# Patient Record
Sex: Female | Born: 1955
Health system: Western US, Academic
[De-identification: ages and names within clinical notes are randomized; demographics above are authoritative.]

---

## 2008-08-07 ENCOUNTER — Emergency Department: Payer: Self-pay | Admitting: Emergency Medicine

## 2010-03-27 ENCOUNTER — Ambulatory Visit: Payer: Self-pay | Admitting: Family Medicine

## 2010-04-11 ENCOUNTER — Ambulatory Visit: Payer: Self-pay | Admitting: Surgery

## 2011-01-01 ENCOUNTER — Encounter: Payer: Self-pay | Admitting: Family Medicine

## 2011-01-25 ENCOUNTER — Encounter: Payer: Self-pay | Admitting: Family Medicine

## 2011-07-09 IMAGING — CT CT ABD-PELV W/ CM
1 of 2 series · 15 of 32 positions shown, 19 images · non-contrast
Comparison: none

REASON FOR EXAM: Left Groin Lymphadenopathy Pain
COMMENTS:

[Series 2: abd with 5.0 i40f · axial · 0.74mm/px · z∈[-96,+309]mm · 15 of 92 slices shown, 19 images]
[im 7/92  soft-tissue]
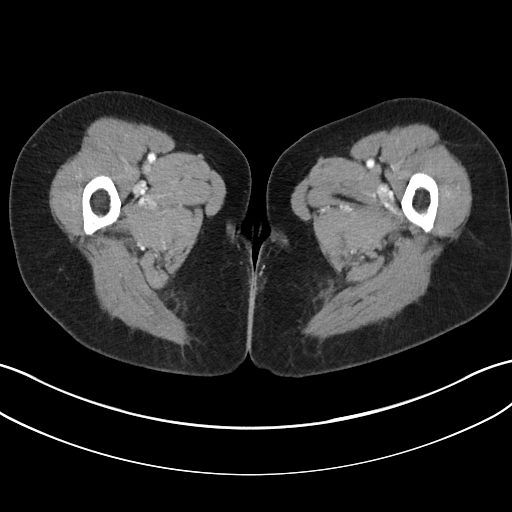
[im 7/92  bone]
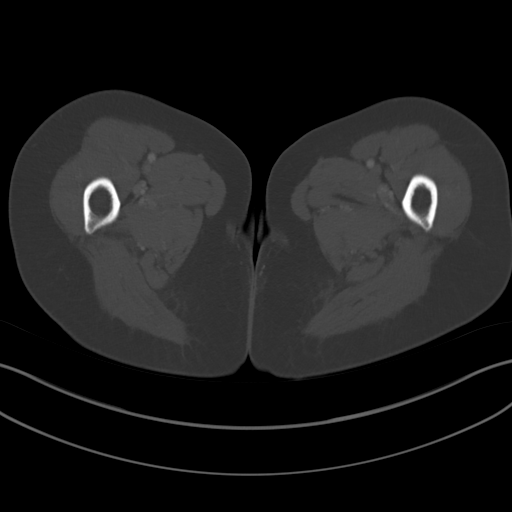
[im 14/92  soft-tissue]
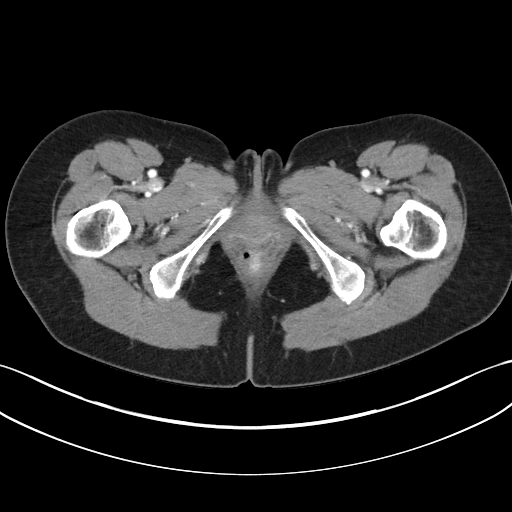
[im 21/92  soft-tissue]
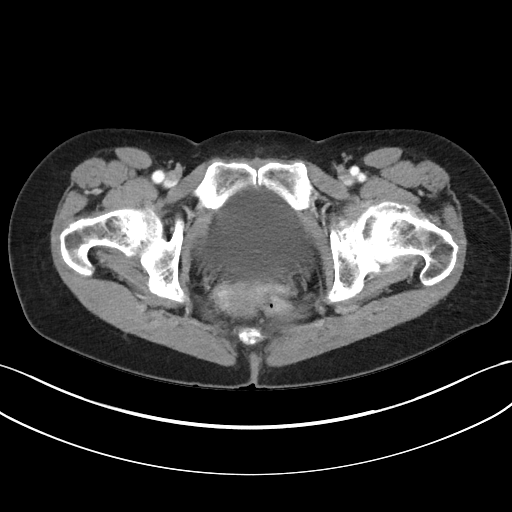
[im 27/92  soft-tissue]
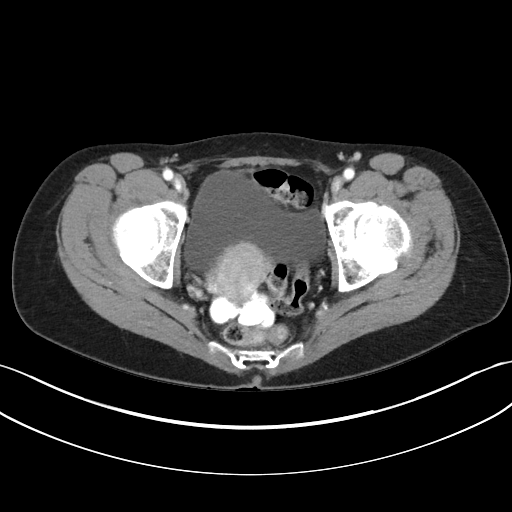
[im 34/92  soft-tissue]
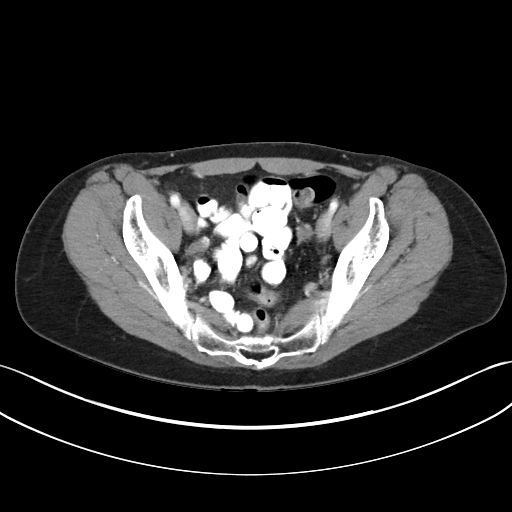
[im 41/92  soft-tissue]
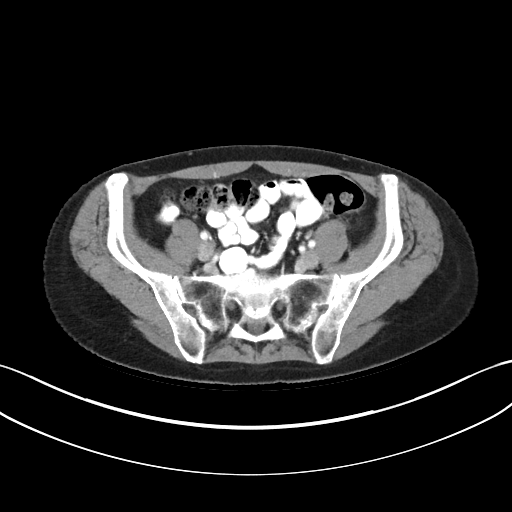
[im 48/92  soft-tissue]
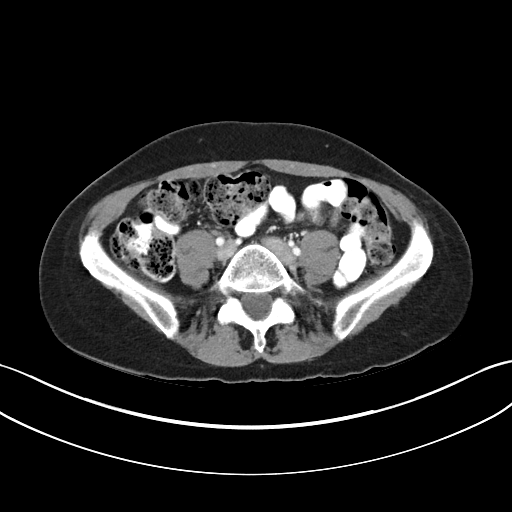
[im 54/92  soft-tissue]
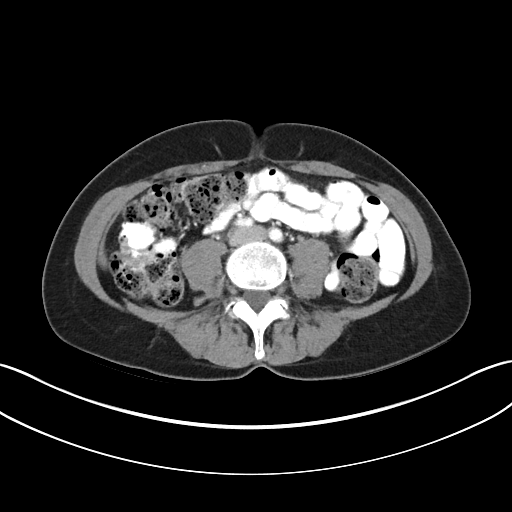
[im 61/92  soft-tissue]
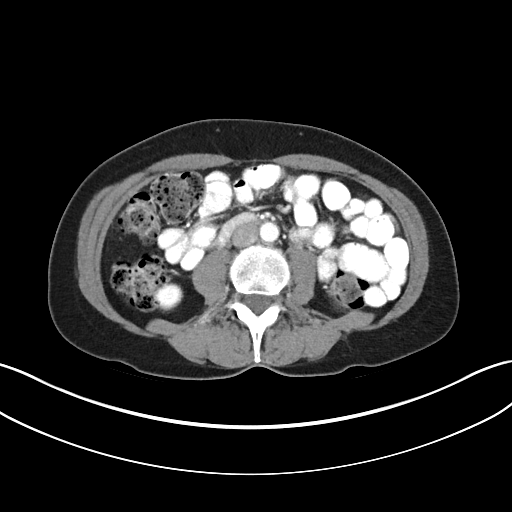
[im 61/92  bone]
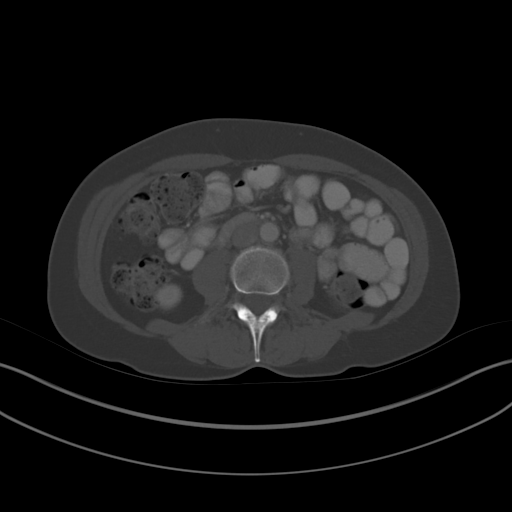
[im 68/92  soft-tissue]
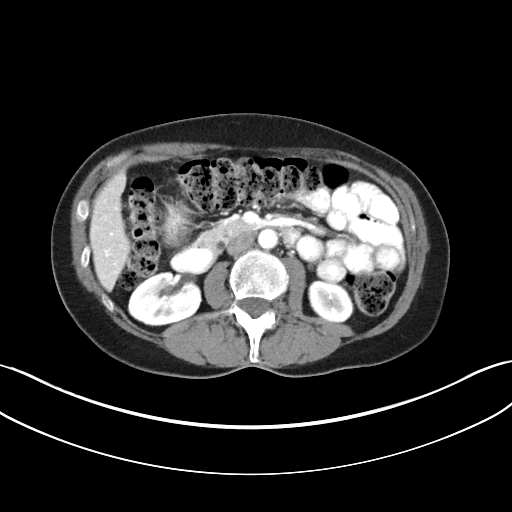
[im 75/92  soft-tissue]
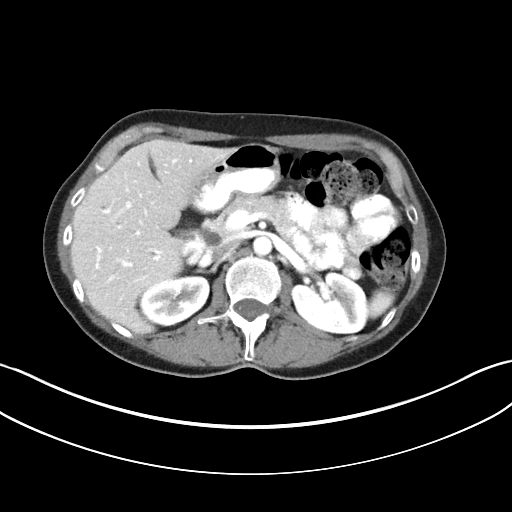
[im 78/92  lung]
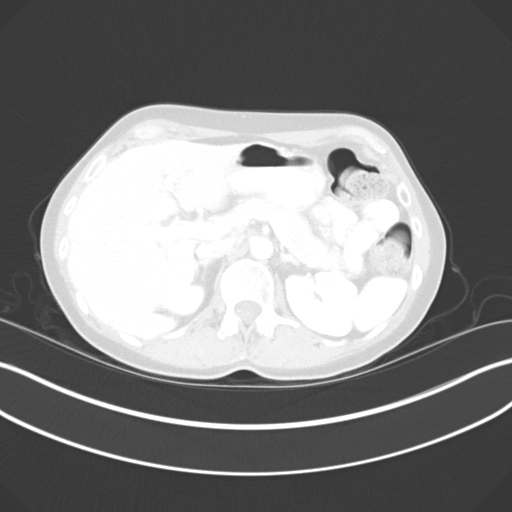
[im 81/92  soft-tissue]
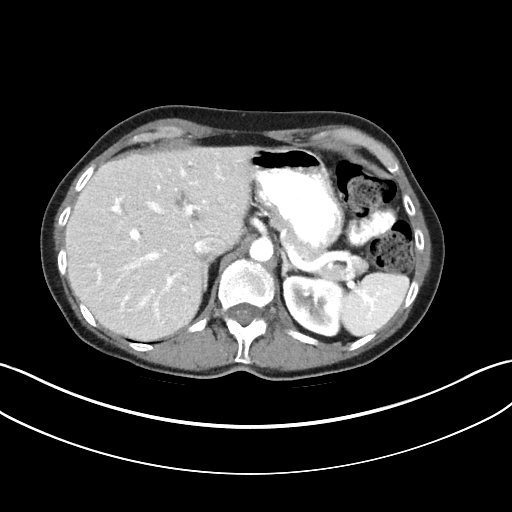
[im 81/92  lung]
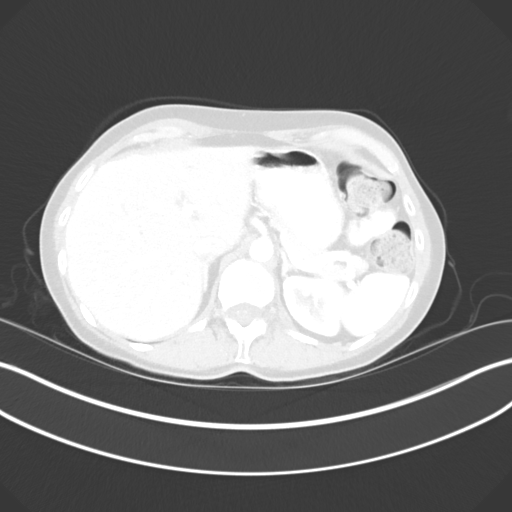
[im 85/92  lung]
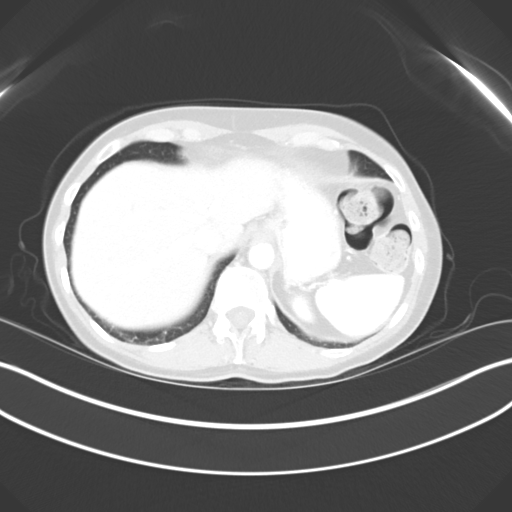
[im 88/92  soft-tissue]
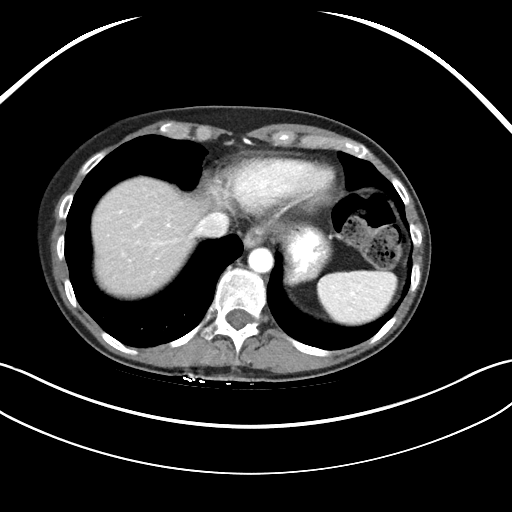
[im 88/92  lung]
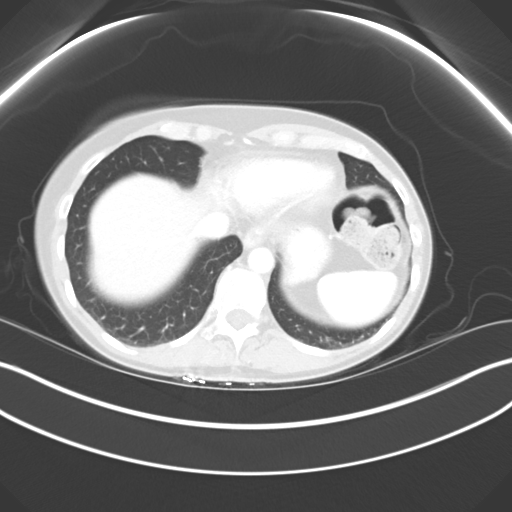

[15 of 32 positions shown; findings below may reference images not displayed]

PROCEDURE:     KCT - KCT ABDOMEN/PELVIS W  - March 27, 2010 [DATE]

RESULT:     Axial CT scanning was performed through the abdomen and pelvis
following intravenous administration of 100 cc of 4sovue-RWP as well as
administration of oral contrast material. Review of multiplanar
reconstructed images was performed separately on the via monitor.

The liver exhibits normal density with no focal mass. There is a trace of
intrahepatic ductal dilation appropriate for the postcholecystectomy state.
The common bile duct is mildly dilated to the head of the pancreas. The
pancreas, spleen, stomach, adrenal glands, and kidneys are normal in
appearance. The caliber of the abdominal aorta is normal. The orally
administered contrast has traversed the small bowel and reached the right
colon. The small bowel is normal in appearance. A moderate amount of stool
is noted throughout the colon. There are are sigmoid diverticula noted but I
do not see evidence of acute diverticulitis. The uterus and adnexal
structures are normal in appearance. There is no free fluid in the abdomen
or pelvis. The caliber of the abdominal aorta is normal. I see no periaortic
nor pericaval lymphadenopathy. I see no inguinal nor umbilical hernia is. No
intra-abdominal or pelvic lymphadenopathy is identified. There are numerous
inguinal lymph nodes but these are subcentimeter in size.

The lung bases are clear. The lumbar vertebral bodies are preserved in
height. There is very gentle curvature of the thoracolumbar spine.
IMPRESSION: 1. I do not see evidence of an intra-abdominal nor pelvic mass.
2. The bowel gas pattern suggests constipation. There is no evidence of
diverticulitis or other acute bowel abnormality. The orally administered
contrast however has not traversed the colon.
3. I do not see acute abnormality of the uterus or adnexa.
4. I do not see acute hepatobiliary abnormality. The gallbladder is
surgically absent.

Followup pelvic ultrasound may be of value if there are clinical or
laboratory values that may indicate pathology of the uterus or adnexa.

## 2016-06-21 ENCOUNTER — Ambulatory Visit (INDEPENDENT_AMBULATORY_CARE_PROVIDER_SITE_OTHER): Payer: BLUE CROSS/BLUE SHIELD

## 2016-06-21 ENCOUNTER — Encounter: Payer: Self-pay | Admitting: Podiatry

## 2016-06-21 ENCOUNTER — Ambulatory Visit (INDEPENDENT_AMBULATORY_CARE_PROVIDER_SITE_OTHER): Payer: BLUE CROSS/BLUE SHIELD | Admitting: Podiatry

## 2016-06-21 VITALS — BP 155/98 | HR 71 | Resp 16

## 2016-06-21 DIAGNOSIS — R52 Pain, unspecified: Secondary | ICD-10-CM | POA: Diagnosis not present

## 2016-06-21 DIAGNOSIS — S93602A Unspecified sprain of left foot, initial encounter: Secondary | ICD-10-CM | POA: Diagnosis not present

## 2016-06-21 DIAGNOSIS — S9032XA Contusion of left foot, initial encounter: Secondary | ICD-10-CM | POA: Diagnosis not present

## 2016-06-21 DIAGNOSIS — M79672 Pain in left foot: Secondary | ICD-10-CM

## 2016-06-21 DIAGNOSIS — M79673 Pain in unspecified foot: Secondary | ICD-10-CM | POA: Diagnosis not present

## 2016-06-21 DIAGNOSIS — R600 Localized edema: Secondary | ICD-10-CM

## 2016-06-21 MED ORDER — NONFORMULARY OR COMPOUNDED ITEM: 1.0000 g | Freq: Four times a day (QID) | 2 refills | Status: AC

## 2016-06-21 MED ORDER — MELOXICAM 15 MG PO TABS: 15.0000 mg | ORAL_TABLET | Freq: Every day | ORAL | 1 refills | Status: AC

## 2016-06-21 NOTE — Progress Notes (Signed)
   Subjective:    Patient ID: Dana ChurchJanna Q Evora, female    DOB: 15-Aug-1956, 60 y.o.   MRN: [id number]  HPI    Review of Systems  HENT: Positive for tinnitus.   Allergic/Immunologic: Positive for food allergies.  All other systems reviewed and are negative.      Objective:   Physical Exam        Assessment & Plan:

## 2016-06-28 ENCOUNTER — Ambulatory Visit: Payer: Self-pay | Admitting: Podiatry

## 2016-06-30 NOTE — Progress Notes (Signed)
Patient ID: Ray ChurchJanna Q Wierman, female   DOB: 05/01/1956, 60 y.o.   MRN: [id number] Subjective:   Patient presents today for pain and tenderness to the left foot. Patient states that she tripped on 06/16/2016 and she noticed immediate pain and tenderness. Patient believes that possibly she broke her foot or ankle. Patient presents today for further treatment and evaluation    Objective/Physical Exam General: The patient is alert and oriented x3 in no acute distress.  Dermatology: Skin is warm, dry and supple bilateral lower extremities. Negative for open lesions or macerations.  Vascular: Palpable pedal pulses bilaterally. No edema or erythema noted. Capillary refill within normal limits.  Neurological: Epicritic and protective threshold grossly intact bilaterally.   Musculoskeletal Exam: Pain on palpation noted throughout the midfoot/Lisfranc joint of the left foot. Moderate edema noted. No pain on palpation to the left ankle joint.  Radiographic Exam:  Normal osseous mineralization. Joint spaces preserved. No fracture/dislocation/boney destruction.   Negative for fracture.  Assessment: #1 Lisfranc foot sprain left #2 edema left foot #3 pain in left foot #4 ecchymosis left foot  Plan of Care:  #1 Patient was evaluated. #2 discussed x-ray findings negative for fracture. #3 prescription for anti-inflammatory pain cream [facility] #4 prescription for meloxicam 15 mg #5 recommend rest ice compression and elevation #6 patient return to clinic in 4 weeks   Dr. Felecia ShellingBrent M. Culley Hedeen, DPM [facility]

## 2016-07-23 ENCOUNTER — Ambulatory Visit: Payer: Self-pay | Admitting: Podiatry

## 2016-07-26 ENCOUNTER — Ambulatory Visit: Payer: Self-pay | Admitting: Podiatry

## 2018-07-07 ENCOUNTER — Ambulatory Visit: Payer: BLUE CROSS/BLUE SHIELD

## 2018-07-07 DIAGNOSIS — Z23 Encounter for immunization: Secondary | ICD-10-CM

## 2021-01-30 ENCOUNTER — Ambulatory Visit: Payer: BLUE CROSS/BLUE SHIELD

## 2021-02-21 ENCOUNTER — Ambulatory Visit: Payer: BLUE CROSS/BLUE SHIELD

## 2021-02-28 ENCOUNTER — Ambulatory Visit: Payer: BLUE CROSS/BLUE SHIELD

## 2021-02-28 ENCOUNTER — Telehealth: Payer: BLUE CROSS/BLUE SHIELD

## 2021-02-28 DIAGNOSIS — R1030 Lower abdominal pain, unspecified: Secondary | ICD-10-CM

## 2021-02-28 DIAGNOSIS — K529 Noninfective gastroenteritis and colitis, unspecified: Secondary | ICD-10-CM

## 2021-02-28 DIAGNOSIS — K580 Irritable bowel syndrome with diarrhea: Secondary | ICD-10-CM

## 2021-02-28 MED ORDER — CHOLESTYRAMINE 4 G PO PACK
1 | PACK | Freq: Two times a day (BID) | ORAL | 3 refills | Status: AC
Start: 2021-02-28 — End: ?

## 2021-02-28 NOTE — Patient Instructions
-   Start cholestyramine powder, 4 g/day. Can increase by 4 g at weekly intervals; up to 4 g six times a day    Take IBGard (over-the-counter encapsulated peppermint) to help with your bloating and abdominal pain. You can buy this usually at major pharmacy stores [facility], [facility], [facility] or on [employer]). Max dose is 8 capsules a day. It's all natural. Peppermint is a natural antispasmodic for the gut. Peppermint can rarely cause worsening of heartburn or anal burning but no other downsides to trying. You can play around with how you take it to see what works for you. Can start with taking it 1-2 capsules in the morning one hour before breakfast and take additional capsules during the day as needed. Or you can try taking it before every meal. If you are taking 2 capsules 2-3 times a day and it doesn't work after 2 weeks, you can just stop it.       - can try L-glutamine 5 grams three times a day    - will check blood work and stool tests    - referral to GI dietitian      - Below is a link for breathing exercises, taught by Donnella Sham, one of the nurse practitioners in the [facility]. These breathing exercises can help with abdominal distention, bloating, and overall mindfulness.     [email or web address]

## 2021-02-28 NOTE — Consults
Gastroenterology Consult   ATTENDING: Pati Gallo. Jonpaul Lumm   PATIENT: Kristy Bullock  MRN: [id number]  DOB: February 07, 1956  DATE OF SERVICE: 02/28/2021  REFERRING PROVIDER: Riley Kill., MD  PRIMARY CARE PROVIDER: No primary care provider on file.    REASON FOR REFERRAL:   Chief Complaint   Patient presents with   ? Diarrhea        Subjective:     Chief Complaint:  Kristy Bullock is a 65 y.o. female who presents for evaluation of chronic diarrhea.     65 year old female with a pmh of previous appendectomy and previous cholecystectomy in 1988 who presents for evaluation of IBS.     The patient reports a history of urgency and persistent diarrhea since around December of 2021. Prior to that she had mostly been on the constipated side and would sometimes go up to 3 days without a bowel movement. She recalls that around the time her symptoms started her husband had been on trip to [place] and everyone there contracted a gastroenteritis and then she also contracted this. Her bowel habits never returned to normal after this. For the past 6 months she has had urgent, persistent diarrhea with around 90% of her stools being Bristol 7. She has been going around 6-10 times a day and has lost around 30 pounds and had associated fatigue and low energy. She also has associated bloating, visible abdominal distention, gas, and lower abdominal cramping. The lower abdominal cramping worsens with diarrhea episodes and is partially improved with defecation.    She was previously seen by an outside gastroenterologist in 10/2020 for chronic diarrhea. She reported diarrhea that started around December 2021/January 2022. She had been taking Aleve prior to the onset of diarrhea. CT abdomen/pelvis in 09/2020 showed pancolitis and she was empirically prescribed metronidazole. However, this did not lead to any improvement. She was then prescribed ciprofloxacin and Align.     She then underwent a colonoscopy in 10/2020 that showed diverticulosis but was otherwise normal. Random colon biopsies showed chronic inflammation with increased lamina propria collagen and patchy increases in eosinophils and lymphocytes. Biopsies were insufficient to make a diagnosis of microscopic colitis. Differential included medication injury, infection, IBS, or an early form of microscopic colitis.     Stool studies were negative for bacterial or parasite infections. Most recent labs from 01/2021 show an elevated CRP of 2.0. ESR normal. TTG IgA normal. No anemia. Normal TSH.     Even prior to the diarrhea episodes she was on a restrictive diet and had eliminated coffee, alcohol, aggregate fruits, melons, processed meats, high fructose corn syrup, soy, artificial sweeteners, carbonated products, gluten, onions, garlic, peppers. She has tried the low FODMAP diet in the past. She has also eliminated lactose, low spice foods, caffeine, collaged peptides, and gas producing foods. She was also seen by her PCP who recommended that she only consume meats and carbs and cut out all fruits and vegetables.     She tried imodium in the past which made her diarrhea worse. She was on nortriptyline for a neurologic issue in the past but this caused her to have worse insomnia. She generally avoids pharmaceuticals if she doesn't have to take them. Reports sensitivities to medications. Currently she is just on VSL#3 and Protonix.     She is a professor and Engineer, civil (consulting) in [address]   Family history of IBS and scoliosis in her mother.         Review of System: the  patient denied fevers, chills, fatigue, nausea, vomiting, constipation, melena, hematochezia,  heartburn, acid reflux, chest pain, palpitations, shortness of breath, jaundice, scleral icterus, confusion, or rashes.       Past Medical & Surgical History:  There is no problem list on file for this patient.     No past medical history on file.  No past surgical history on file.    Relevant Family History: [x]   No family history of colorectal cancer, gastric cancer, inflammatory bowel disease or celiac disease.   []   Family history of colorectal cancer    []   Family history of IBD   []   Family history of celiac disease    Family history of IBS in mother      Relevant Social History:  No tobacco or drug use      MEDICATIONS:  Medications that the patient states to be currently taking   Medication Sig   ? pantoprazole 40 mg DR tablet TAKE 1 TABLET BY MOUTH EVERY DAY BEFORE BREAKFAST   ? Probiotic Product (MISC INTESTINAL FLORA REGULAT) CAPS          Allergies :    is allergic to codeine.      Physical Exam:    Vitals: BP 109/69  ~ Pulse 88  ~ Temp 36.6 ?C (97.9 ?F) (Oral)  ~ Resp 17  ~ Ht 5' 6'' (1.676 m)  ~ Wt 114 lb 9.6 oz (52 kg)  ~ SpO2 97%  ~ BMI 18.50 kg/m?    Body mass index is 18.5 kg/m?Marland Kitchen   Constitutional:WD/WN alert, appears stated age and cooperative   HEENT: no scleral icterus visible   Skin: no jaundice visible   Neuro: A and O x3    Psychiatric: Appropriate affect         Lab Review:  No results found for: WBC, HGB, HCT, MCV, PLT  No results found for: CREAT, BUN, NA, K, CL, CO2, ALT, AST, GGT, ALKPHOS, BILITOT, ALBUMIN, AMYLASE, LIPASE  Lab Results   Component Value Date    GLIADINIGA 1.5 02/28/2021    GLIADINIGG <1.0 02/28/2021     No results found for: INR, FE, FEBINDSAT, TIBC, FERRITIN, VITAMINB12, FOLATE  No results found for: HELPYLAB, HELPYLAGSTL      Imaging:     CT Abdomen/Pelvis (09/2020):    IMPRESSION: ?   Moderate pancolitis. ?Correlate clinically for underlying etiology.       GI Studies:     Colonoscopy (10/2020):  Findings:   ?? ? A few diverticula were found in the descending colon and transverse   ?? ? colon.   ?? ? The exam was otherwise without abnormality.   ?? ??Random colon biopsies obtained, Biopsies were taken with a cold forceps   ?? ? for histology.   ?? ? ? ? ? ? ? ? ? ? ? ? ?   FINAL DIAGNOSIS     A.  Random colon, biopsy:  ? Colonic mucosa with increased chronic inflammation, lamina propria collagen and patchy increase in eosinophils.  ? Negative for active inflammation, granulomas and dysplasia.  ?  ?   A microscopic examination is performed unless specimens are designated as ''gross only''.  ?   Electronically signed by Thereasa Solo, MD on 11/06/2020 at ?3:20 PM   COMMENT     Sections from the biopsy show an increased number of lymphocytes, plasma cells and eosinophils in the lamina propria.  Mild patchy intra-epithelial lymphocytes are present, quantitatively is insufficient for a definitive diagnosis  of lymphocytic/collagenous colitis. The findings in the colon biopsy are non-specific with possible etiologies including medication injury, infection, irritable bowel syndrome, allergies, or an early/resolving/variant form of microscopic colitis.         Medical Decision Making addressed on 02/28/2021:      Number and Complexity of Problems Addressed at the Encounter:   [x]   1 or more chronic illness with exacerbation, progression, or side effects of treatment  []   2 or more stable chronic illness  []   1 undiagnosed new problem with uncertain diagnosis  []   1 acute illness with systemic symptoms  []   1 acute complicated injury     Review of Data: I have  [x]  Reviewed/ordered ? 3 unique laboratory, radiology, and/or diagnostic tests noted    [x]  Reviewed prior external notes and incorporated into patient assessment as noted  []  I have independently interpreted test performed by other physician(s) as noted   []  Discussed management or test interpretation with external provider(s) as noted       Risk of Complication and or Morbidity or Mortality of Patient Management including Social Determinants of Health:   [x]   I deem the above diagnoses to have a risk of complication, morbidity or mortality of Moderate   []  The diagnosis or treatment of said conditions is significantly limited by social determinants of health as noted.     Visit Diagnoses  / Problems addressed on 02/28/2021: Encounter Diagnoses   Name Primary?   ? Chronic diarrhea    ? Irritable bowel syndrome with diarrhea Yes   ? Lower abdominal pain           Assessment & Plan :       65 year old female with a pmh of previous appendectomy and previous cholecystectomy in 1988 who presents for evaluation of IBS.     #Post-Infection Irritable Bowel Syndrome:  #Chronic Diarrhea:  #Lower Abdominal Pain:  Patient with around a 6 month history of crampy lower abdominal pain, bloating, visible abdominal distention, and persistent watery diarrhea (mostly Bristol 7). Abdominal pain is related to bowel movements and partially relieved with defecation. Symptoms are exacerbated by dietary intake and she is currently on a very restricted diet of meat and carbs (previously eliminated dairy, gluten, alcohol, certain fruits, carbonated beverages, onions, garlic, etc). Suspect that she likely has post-infection IBS with increased visceral hypersensitivity as symptoms started after she had a viral gastroenteritis back in December 2021. May also have a component of abdominophrenic dyssynergia given visible abdominal distention. However, microscopic colitis would still be in the differential. Previous work up has included a CT abdomen/pelvis that showed pancolitis and she was empirically prescribed metronidazole and ciprofloxacin without improvement. Colonoscopy in 10/2020 was endoscopically normal but random colon biopsies showed chronic inflammation with increased lamina propria collagen and patchy increases in eosinophils and lymphocytes. Biopsies were insufficient to make a diagnosis of microscopic colitis, with differential including medication injury, infection, IBS, or an early form of microscopic colitis (had previously been taking Aleve). She has mostly tried dietary changes and natural therapies for her symptoms as she reports sensitivities to many medications. Currently she is just taking VSL#3 and Protonicx. Discussed the concept of the brain gut axis in detail.     Plan:  - Fecal calprotectin  - Pancreatic stool elastase  - Celiac antibodies and HLA genetic testing as patient on a gluten free diet  - Trial of IBGard for abdominal cramping  - Trial of cholestyramine for diarrhea  - L glutamine  5 g TID for post infection IBS  - Referral to GI Dietitian  - Video link on diaphragmatic breathing exercises provided. Courtesy of Donnella Sham  - Continue relaxation techniques and mindfulness exercises  - If no improvement in symptoms, consider repeat colonoscopy with additional biopsies to evaluate for microscopic colitis  - Discussed potential trial of TCAs in the future, but patient is hesitant at this time given previous side effects from nortriptylline             The above recommendation were discussed with the patient. The patient has all questions answered satisfactorily and is in agreement with this recommended plan of care.    Amahia Madonia L. Lincoln Brigham, MD   02/28/2021 12:52 PM      I reviewed the patient's labs, imaging, and/or endoscopies and my interpretation is above.     I reviewed notes from the patient's outside records and have incorporated this into the patient history.     80  minutes were spent personally by me today on this encounter which may include today's pre-visit review of the chart, time spent during the visit, and  today's time spent  after the visit  documenting and coordinating care.

## 2021-02-28 NOTE — Telephone Encounter
MESSAGE COMPLETE   Message Complete, No further Action Required.    Ok to [employer].   A. Contacted patient to schedule NEW visit with the nutritionist/dietitian  [x]  patient answered  []  left a voicemail  []  no answer  []  wrong number  []  spoke with patient representative  [x]  other/s: please specify:  - requested patient to call her insurance to confirm coverage of nutrition service  - also sent patient message via myChart for referral details and information     B. Informed patient of nutritional counseling insurance' non-coverage overview  [x]  Medicare, PPO, HMO  []  provided the CPT codes  []  option for self-pay  []  clearance from [facility]  []  submission of signed form (5) days before appointment  []  other/s: please specify:    C. Patient scheduled  [x]  yes  []  no  [x]  other/s: comments:   - scheduled and confirmed for 06/07/2021

## 2021-03-01 ENCOUNTER — Ambulatory Visit: Payer: BLUE CROSS/BLUE SHIELD

## 2021-03-05 ENCOUNTER — Ambulatory Visit: Payer: BLUE CROSS/BLUE SHIELD

## 2021-03-05 DIAGNOSIS — R195 Other fecal abnormalities: Secondary | ICD-10-CM

## 2021-03-06 ENCOUNTER — Ambulatory Visit: Payer: BLUE CROSS/BLUE SHIELD

## 2021-03-06 DIAGNOSIS — R195 Other fecal abnormalities: Secondary | ICD-10-CM

## 2021-03-20 ENCOUNTER — Ambulatory Visit: Payer: BLUE CROSS/BLUE SHIELD

## 2021-06-03 ENCOUNTER — Ambulatory Visit: Payer: BLUE CROSS/BLUE SHIELD

## 2021-06-06 ENCOUNTER — Ambulatory Visit: Payer: BLUE CROSS/BLUE SHIELD

## 2021-06-06 ENCOUNTER — Telehealth: Payer: BLUE CROSS/BLUE SHIELD

## 2021-06-06 NOTE — Telephone Encounter
Is patient cleared for her appt?

## 2021-06-07 ENCOUNTER — Telehealth: Payer: BLUE CROSS/BLUE SHIELD

## 2021-06-07 NOTE — Progress Notes
[facility]  Medical Nutrition Therapy   Initial Assessment Note      PATIENT: Kristy Bullock  MRN: [id number]  DOB: Mar 08, 1956  DATE OF SERVICE: 06/07/2021         BILLING METHOD: insurance     SUBJECTIVE     Kristy Bullock is referred by Riley Kill for medical nutrition therapy for collagenous microscopic colitis.    06/07/21:  ? Medical history   o Long history of ''touchy'' gut   - Cutting out many foods over last few decades to avoid painful bloating (distension??) and belching , rashes, and ''dives in metabolism''  o Noise overloads her - uses noise canceling headphones to help diffuse noise   o Tinnitis - goes to background when in a flow state   o Collagenous microscopic colitis (diagnosed 07/2020-08/2020)  - Currently on Budesonide ER 9 mg, cholestyramine 4 grams (2-4 times/day)  - Severe per patient report   - Constant acid and ill feeling per report   - Prior to diagnosis, was having at least 10 BM daily with nocturnal bowel movements   - Some improvement with avoidance of insoluble fiber (fruits and vegetables)   - Cramping   ? Especially bad the last week or so   ? Started on cholestyramine - hoping will help control nausea   - Nausea  ? Prevents her from eating more    ? Bowel habits   o Diarrhea - worse with vegetables and stress    ? Prior to diagnosis - would have at least 10 BM and nocturnal BM   ? Has had increased stress last 3 weeks; was having 4-5 BM daily with use of cholestyramine   ? Good day = 4 bowel movements   ? Bad day = 10 bowel movements   ? Average last month - 6 BM daily; BSFS type 6-7 (stool is difficult to see in toilet after passing - ''in a cloud'')  ? Stringy BSFS type 5   ? No greasiness or oiliness   ? Ongoing nocturnal BM   ? Incomplete evacuation   ? Interventions tried:   ? Cholestyramine (July) - good aid   ? Psyllium husk - stopped due to advice of PCP  ? IBGard - minimal aid   ? Tums - can provide some aid   ? VSL#3 - stopped due to lack of supporting evidence   ? L-glutamine - used 5 g/day - stopped taking   ? Diet   ? Never had body image issues   ? Does not cook and does not wish to cook   ? Long history of not tolerating fillers and additives  ? Using [facility] book for diet guidance now (mix of low FODMAP and paleo, recommends [facility] lab for testing)   ? No longer using low FODMAP diet because diet is so limited   ? Has good appetite per report   ? Poor when ''bile maxxing out'' and has nausea   ? Has been eating over nausea - this is her baseline per report    ? Current intake: ''clean meat'' (baked, baked mashed potatoes, 1/3 green banana, low FODMAP serving size vheddar cheese, Breton GF crackers, GF bread with no additives  ? Able to add brown rice, 1/3 cup rinsed black beans, <2T walnuts or [place] nuts, pumpkin seeds, half can of tuna with mayo, and 1 cup of lays potato chips   ? Currently avoiding: carrageenan, polysorbate 80, carboxymethylcellulose, coconut oil, palm  oil, ''gums'' and ''pastes''   ? Has not been tolerating seafood, pea protein well   ? Started low FODMAP diet in March; no longer following due to restricted diet    ? Was relying on fruits, vegetables, organic chicken - symptoms were worsening   ? Lactose free - poorly tolerated due to additives  ? Gluten free - poorly tolerated due to additives   ? Previously eliminated:   ? Coffee  ? EtOH  ? Aggregate fruits   ? Melons  ? Processed meats  ? Honey   ? HFCS  ? Soy  ? Artificial sweeteners  ? All sugars other than cane sugar  ? Carbonated water   ? Gluten  ? Onions  ? Garlic   ? Peppers   ? ''Sour'' foods - vinegar  ? After [facility] diagnosis, eliminated:  ? Eggs  ? Oats and oat bran   ? caffeinated sodas and teas  ? Lactose   ? Spicy foods (even very mild)   ? Weight history   ? 66''  ? Usual weight: 138 lbs  ? Comfortable weight: 111-113 lbs   ? Current weight: 105 lbs   ? Previous college athlete   ? Feels as though lean body mass is ''gone'' and replaced with wrinkled skin   ? Physical activity   ? Has been working on activity regimen until recently   ? Goal: walk 2 2 mile hikes [place] [address]   ? Unable to go that far - feels ''tied to the toilet''  ? Stress  ? Was using Mahana app for deep breathing   ? Did not feel like visualizing was helpful   ? Has had increased stressors over the last 3 weeks   ? Sleep  ? 5 hours/night due to nocturnal BM     Questions: recommended foods, supplements, weight gain, which foods to avoid, are calcium and vitamin D OK, B vitamins?     Dr. Lincoln Brigham 02/28/21  Assessment & Plan :     65 year old female with a pmh of previous appendectomy and previous cholecystectomy in 1988 who presents for evaluation of IBS.   ?  #Post-Infection Irritable Bowel Syndrome:  #Chronic Diarrhea:  #Lower Abdominal Pain:  Patient with around a 6 month history of crampy lower abdominal pain, bloating, visible abdominal distention, and persistent watery diarrhea (mostly [place] 7). Abdominal pain is related to bowel movements and partially relieved with defecation. Symptoms are exacerbated by dietary intake and she is currently on a very restricted diet of meat and carbs (previously eliminated dairy, gluten, alcohol, certain fruits, carbonated beverages, onions, garlic, etc). Suspect that she likely has post-infection IBS with increased visceral hypersensitivity as symptoms started after she had a viral gastroenteritis back in December 2021. May also have a component of abdominophrenic dyssynergia given visible abdominal distention. However, microscopic colitis would still be in the differential. Previous work up has included a CT abdomen/pelvis that showed pancolitis and she was empirically prescribed metronidazole and ciprofloxacin without improvement. Colonoscopy in 10/2020 was endoscopically normal but random colon biopsies showed chronic inflammation with increased lamina propria collagen and patchy increases in eosinophils and lymphocytes. Biopsies were insufficient to make a diagnosis of microscopic colitis, with differential including medication injury, infection, IBS, or an early form of microscopic colitis (had previously been taking Aleve). She has mostly tried dietary changes and natural therapies for her symptoms as she reports sensitivities to many medications. Currently she is just taking VSL#3 and Protonicx.  Discussed the concept of the brain gut axis in detail.   ?  Plan:  - Fecal calprotectin  - Pancreatic stool elastase  - Celiac antibodies and HLA genetic testing as patient on a gluten free diet  - Trial of IBGard for abdominal cramping  - Trial of cholestyramine for diarrhea  - L glutamine 5 g TID for post infection IBS  - Referral to GI Dietitian  - Video link on diaphragmatic breathing exercises provided. Courtesy of Donnella Sham  - Continue relaxation techniques and mindfulness exercises  - If no improvement in symptoms, consider repeat colonoscopy with additional biopsies to evaluate for microscopic colitis  - Discussed potential trial of TCAs in the future, but patient is hesitant at this time given previous side effects from nortriptylline     DIET  / SUPPLEMENTS     Food Intake:   When feeling better, can add brown rice, black beans (1/3 cup), chopped walnuts and pumpkin seeds, 1/2 can tuna with may + water on GF bread, cheese sandwich with cheddar cheese, Lays' potato chips; tried popsicles in half when craving sugar, clif builder bar (white)   ? Breakfast: whole diet right now: baked chicken (sometimes 1/2 cup), pork tenderloin, mashed potatoes, 1/3 green banana,  cheese    ? Lunch:  [employer] (tapioca base) mixed with almond milk OR gatorade powder   ? Dinner:     ? Snacks: Breton GF crackers, cheddar   ? Beverages: water upset her stomach so she adds       ? Avoided Foods -  Rice flour, apples, milk                       ? Well Tolerated Foods -                                   Supplements: ''chelated magnesium'' 100 mg BID OBJECTIVE     Current weight: (review flow sheet for more weights)  Wt Readings from Last 12 Encounters:   02/28/21 114 lb 9.6 oz (52 kg)     Ht Readings from Last 1 Encounters:   02/28/21 5' 6'' (1.676 m)     There is no height or weight on file to calculate BMI.      Usual body weight:  138 lbs                        Recent weight changes:   Lost to 105 lbs in ~6 months (24% - clinically significant)                   IBW: 130 lbs       %IBW: 81%      Alarm Features: (check for yes)  []  Age of onset >50  []  Family history of colorectal cancer / IBD / celiac disease  []  Rectal bleeding or anemia  []  Nocturnal bowel movements  [x]  Unintentional weight loss  [x]  Persistent daily diarrhea  []  Recurrent vomiting  []  Fever  []  Progressive worsening symptoms    Current Outpatient Medications   Medication Sig   ? cholestyramine 4 g powder packet Take 1 packet (4 g total) by mouth two (2) times daily.   ? pantoprazole 40 mg DR tablet TAKE 1 TABLET BY MOUTH EVERY DAY BEFORE BREAKFAST   ? Probiotic Product (MISC INTESTINAL FLORA REGULAT) CAPS  No current facility-administered medications for this visit.       PERTINENT LABS / PROCEDURES     Component      Latest Ref Rng & Units 02/28/2021 03/01/2021   (TTG) Ab, IgG ([facility])      U/mL <1.0    (TTG) Ab, IgA ([facility])      U/mL <1.0    Gliadin (Deamidated) Ab (IGA) ([facility])      U/mL 1.5    Gliadin (Deamidated) Ab (IGG) ([facility])      U/mL <1.0    Immunoglobulin A ([facility])      70 - 320 mg/dL 696    Calprotectin, Stool ([facility])      mcg/g  422 (H)   Pancreatic Elastase-1 ([facility])      mcg/g  >500     03/09/21  Comment:   The patient has one of the HLA-DQ variants associated   with celiac disease     Triglycerides 30 - 150 mg/dL 86    Cholesterol 80 - 200 mg/dL 295?High?    HDL 30 - 90 mg/dL 46    Chol/HDL Ratio 3.7 - 5.6 4.5    LDL, Calculated 60 - 130 mg/dL 284?High?    VLDL 1 - 40 mg/dL 13.2      VITAMIN G-40 230 - 1050 pg/mL >2000?High?     Vitamin D, 25 Hydroxy 30 - 90 ng/mL 42 Comment: Vitamin D Status ? ? ? ? 24OH Vitamin D     Colonoscopy (10/2020):  Findings:   ?? ? A few diverticula were found in the descending colon and transverse   ?? ? colon.   ?? ? The exam was otherwise without abnormality.   ?? ??Random colon biopsies obtained, Biopsies were taken with a cold forceps   ?? ? for histology.   ?? ? ? ? ? ? ? ? ? ? ? ? ?   FINAL DIAGNOSIS  ?   A. ?Random colon, biopsy:  ? Colonic mucosa with increased chronic inflammation, lamina propria collagen and patchy increase in eosinophils.  ? Negative for active inflammation, granulomas and dysplasia.  ?  ?  ?A microscopic examination is performed unless specimens are designated as ''gross only''.  ?   Electronically signed by Thereasa Solo, MD on 11/06/2020 at ?3:20 PM   COMMENT  ?   Sections from the biopsy show an increased number of lymphocytes, plasma cells and eosinophils in the lamina propria. ?Mild patchy intra-epithelial lymphocytes are present, quantitatively is insufficient for a definitive diagnosis of lymphocytic/collagenous colitis. The findings in the colon biopsy are non-specific with possible etiologies including medication injury, infection, irritable bowel syndrome, allergies, or an early/resolving/variant form of microscopic colitis.   ?     CT Abdomen/Pelvis (09/2020):  ?  IMPRESSION: ?   Moderate pancolitis. ?Correlate clinically for underlying etiology.   ?                                ASSESSMENT     ? Diarrhea/ Microscopic colitis  - Prior to diagnosis, was having 10+ bowel movements daily with nocturnal bowel movements. Tried multiple diet interventions such as low FODMAP with no aid to symptoms. Recently diagnosed s/p colonoscopy and started on Budesonide and cholestyramine. Medications initially provided good aid with reduction in stool frequency, urgency, and improved form. However, symptoms increased in last 3 weeks possibly related to stress. Reviewed fiber modification for improved tolerance and to maintain diet  variety. Encouraged patient to focus on protein intake to restore lean body mass as well as maintaining hydration using oral rehydration solutions. Recommended patient incorporate diaphragmatic breathing to manage stress during this acute period.   ? Moderate protein calorie malnutrition - Symptoms increased March 2022 with subsequent elimination diets and limited intake. Reports ongoing nausea but tries to maintain POs. Endorses 33 lbs weight loss in about 6 months, which reflects 24% weight loss in 6 months - clinically significant. Per diet recall, patient meeting <75% estimated energy needs. Likely with fat and muscle losses but unabel visualize stores at this time. Meets ASPEN criteria for moderate protein calorie malnutrition. Reviewed strategies for increasing oral intake in setting of GI symptoms.     Estimated Needs:  1680-1909 kcals (35-40 kcal/kg based on reported weight 48 kg, increased for weight loss)   58-72 grams protein (1.2-1.5 g/kg based on reported weight 48 kg)   PLAN     Total time spent counseling patient:  60 minutes                                                                                                                                                                Practice diaphragmatic breathing before meals to reduce stress response   Start with 5 breaths and increase as you have more time   [email or web address]  ? Weight gain   ? Aim for 57-72 grams protein daily   ? Refer to protein handout in email for food sources   ? We can only absorb about 25 grams per sitting as women   ? Recommend using Isopure Whey Protein isolate protein powder to bulk up foods you are already consuming   ? Add to tea, mashed potatoes, broths/soups, smoothies, water  ? Pre-made protein water options include BiPro, Gatorade Zero with Protein, Protein 20   ? Have at least one protein shake daily (prepared options below OR homemade)   ? Recipe: milk of choice (consider Ripple protein milk) + protein powder + fruit of choice   ? Trial OWYN or Ripple protein shakes  ? Add nut butter to banana and toast  ? Walnut butter: [email or web address]  ? Pecan butter   ? [email or web address].[email or web address]  ? Use GI friendly soluble fiber list to vary diet intake and expand fruit and vegetable options   ? Start with 1/4 cup on the first day   ? Increase to 1/3 cup on the second day   ? Increase 1/2 cup on the third day   ? Increase 1 cup on the 4th day  Trial Sunfiber for bowel regularity   ? Start with 1/4 scoop mixed into 8 ounces fluid, use this dose for 3 days   ? Increase by 1/4 scoop every 2-3 days as needed to get  less frequent and better formed stools   ? Max dose is 1 scoop daily   Recommend maintaining hydration using 1 L oral rehydration solution daily:  Drip Drop (Kristy Bullock's favorite) or Liquid IV  Pedialyte  Homemade recipe:  1 quart water  3/4 tsp salt  6 tsp sugar  Crystal light to taste  Gatorade - 2 cups Gatorade, 2 cups water, 1/2 tsp table salt  G2 - 4 cups Gatorade, 1/2 to 3/4 tsp table salt  Broth - 2 cups liquid broth, 2 cups water, 2 tbsp sugar  ? Follow up in 4 weeks: [phone]     ? Further considerations: bowel habits, weight, diet variety and tolerance, physical activity (resistance bands), hydration, micronutrient status                                                                                                          Georgiann Mohs, MS, RD      06/07/2021 ? [email or web address]  ? Use GI friendly soluble fiber list to vary diet intake and expand fruit and vegetable options   ? Start with 1/4 cup on the first day   ? Increase to 1/3 cup on the second day   ? Increase 1/2 cup on the third day   ? Increase 1 cup on the 4th day  Trial Sunfiber for bowel regularity   ? Start with 1/4 scoop mixed into 8 ounces fluid, use this dose for 3 days   ? Increase by 1/4 scoop every 2-3 days as needed to get less frequent and better formed stools   ? Max dose is 1 scoop daily   Recommend maintaining hydration using 1 L oral rehydration solution daily:  Drip Drop (Kristy Bullock's favorite) or Liquid IV  Pedialyte  Homemade recipe:  1 quart water  3/4 tsp salt  6 tsp sugar  Crystal light to taste  Gatorade - 2 cups Gatorade, 2 cups water, 1/2 tsp table salt  G2 - 4 cups Gatorade, 1/2 to 3/4 tsp table salt  Broth - 2 cups liquid broth, 2 cups water, 2 tbsp sugar  ? Follow up in 4 weeks: [phone]     ? Further considerations:                                                                                                          Georgiann Mohs, MS, RD      06/07/2021

## 2021-06-08 NOTE — Patient Instructions
Practice diaphragmatic breathing before meals to reduce stress response   Start with 5 breaths and increase as you have more time   [email or web address]  Weight gain   Aim for 57-72 grams protein daily   Refer to protein handout in email for food sources  We can only absorb about 25 grams per sitting as women   Recommend using Isopure Whey Protein isolate protein powder to bulk up foods you are already consuming   Add to tea, mashed potatoes, broths/soups, smoothies, water  Pre-made protein water options include BiPro, Gatorade Zero with Protein, Protein 20   Have at least one protein shake daily (prepared options below OR homemade)   Recipe: milk of choice (consider Ripple protein milk) + protein powder + fruit of choice   Trial OWYN or Ripple protein shakes  Add nut butter to banana and toast  Walnut butter: [email or web address]  Pecan butter   [email or web address].es  Use GI friendly soluble fiber list to vary diet intake and expand fruit and vegetable options   Start with 1/4 cup on the first day   Increase to 1/3 cup on the second day   Increase 1/2 cup on the third day   Increase 1 cup on the 4th day  Trial Sunfiber for bowel regularity   Start with 1/4 scoop mixed into 8 ounces fluid, use this dose for 3 days   Increase by 1/4 scoop every 2-3 days as needed to get less frequent and better formed stools   Max dose is 1 scoop daily   Recommend maintaining hydration using 1 L oral rehydration solution daily:  Drip Drop (Nazifa Trinka's favorite) or Liquid IV  Pedialyte  Homemade recipe:  1 quart water  3/4 tsp salt  6 tsp sugar  Crystal light to taste  Gatorade - 2 cups Gatorade, 2 cups water, 1/2 tsp table salt  G2 - 4 cups Gatorade, 1/2 to 3/4 tsp table salt  Broth - 2 cups liquid broth, 2 cups water, 2 tbsp sugar  Follow up in 4 weeks: [phone]

## 2021-06-12 ENCOUNTER — Telehealth: Payer: BLUE CROSS/BLUE SHIELD

## 2021-06-12 NOTE — Telephone Encounter
PDL Call to Clinic    Reason for Call:patient received vm regarding appt r/s for 06/20/21    Appointment Related?  [x]  Yes  []  No     If yes;  Date:  Time:    Call warm transferred to PDL: [x]  Yes  []  No    Call Received by Clinic Representative:susana     If call not answered/not accepted, call received by Patient Services Representative:

## 2021-06-12 NOTE — Telephone Encounter
Patient has been scheduled for 10/26 at 8:15 via video visit.

## 2021-06-13 ENCOUNTER — Ambulatory Visit: Payer: BLUE CROSS/BLUE SHIELD

## 2021-06-13 DIAGNOSIS — E440 Moderate protein-calorie malnutrition: Secondary | ICD-10-CM

## 2021-06-13 DIAGNOSIS — K529 Noninfective gastroenteritis and colitis, unspecified: Secondary | ICD-10-CM

## 2021-06-20 ENCOUNTER — Telehealth: Payer: BLUE CROSS/BLUE SHIELD

## 2021-06-20 DIAGNOSIS — K52839 Microscopic colitis, unspecified: Secondary | ICD-10-CM

## 2021-06-20 DIAGNOSIS — K580 Irritable bowel syndrome with diarrhea: Secondary | ICD-10-CM

## 2021-06-20 MED ORDER — BUDESONIDE 3 MG PO CPEP
6 mg | ORAL_CAPSULE | Freq: Every day | ORAL | 3 refills | Status: AC
Start: 2021-06-20 — End: 2021-06-21

## 2021-06-20 NOTE — Progress Notes
Patient Consent to Telehealth   The patient agreed to participate in the video visit prior to joining the visit.        Gastroenterology Consult   ATTENDING: Pati Gallo. Giannina Bartolome   PATIENT: Kristy Bullock  MRN: [id number]  DOB: 07-06-1956  DATE OF SERVICE: 06/20/2021  REFERRING PROVIDER: Riley Kill., MD  PRIMARY CARE PROVIDER: No primary care provider on file.    REASON FOR REFERRAL:   Chief Complaint   Patient presents with   ? Follow-up        Subjective:     Chief Complaint:  Kristy Bullock is a 65 y.o. female who presents for follow up evaluation of chronic diarrhea.     65 year old female with a pmh of previous appendectomy and previous cholecystectomy in 1988 who presents for evaluation of IBS.     She was last seen in the GI clinic in 02/2021. The patient reports a history of urgency and persistent diarrhea since around December of 2021. Prior to that she had mostly been on the constipated side and would sometimes go up to 3 days without a bowel movement. She recalls that around the time her symptoms started her husband had been on trip to [place] and everyone there contracted a gastroenteritis and then she also contracted this. Her bowel habits never returned to normal after this. For the past 6 months she has had urgent, persistent diarrhea with around 90% of her stools being Bristol 7. She has been going around 6-10 times a day and has lost around 30 pounds and had associated fatigue and low energy. She also has associated bloating, visible abdominal distention, gas, and lower abdominal cramping. The lower abdominal cramping worsens with diarrhea episodes and is partially improved with defecation.    She was previously seen by an outside gastroenterologist in 10/2020 for chronic diarrhea. She reported diarrhea that started around December 2021/January 2022. She had been taking Aleve prior to the onset of diarrhea. CT abdomen/pelvis in 09/2020 showed pancolitis and she was empirically prescribed metronidazole. However, this did not lead to any improvement. She was then prescribed ciprofloxacin and Align.     She then underwent a colonoscopy in 10/2020 that showed diverticulosis but was otherwise normal. Random colon biopsies showed chronic inflammation with increased lamina propria collagen and patchy increases in eosinophils and lymphocytes. Biopsies were insufficient to make a diagnosis of microscopic colitis. Differential included medication injury, infection, IBS, or an early form of microscopic colitis.     Stool studies were negative for bacterial or parasite infections. Most recent labs from 01/2021 show an elevated CRP of 2.0. ESR normal. TTG IgA normal. No anemia. Normal TSH.     Even prior to the diarrhea episodes she was on a restrictive diet and had eliminated coffee, alcohol, aggregate fruits, melons, processed meats, high fructose corn syrup, soy, artificial sweeteners, carbonated products, gluten, onions, garlic, peppers. She has tried the low FODMAP diet in the past. She has also eliminated lactose, low spice foods, caffeine, collaged peptides, and gas producing foods. She was also seen by her PCP who recommended that she only consume meats and carbs and cut out all fruits and vegetables.     She tried imodium in the past which made her diarrhea worse. She was on nortriptyline for a neurologic issue in the past but this caused her to have worse insomnia. She generally avoids pharmaceuticals if she doesn't have to take them. Reports sensitivities to medications. Currently she is just on VSL#3  and Protonix.     She is a professor and Engineer, civil (consulting) in [address]   Family history of IBS and scoliosis in her mother.       Interval Events:  Celiac antibodies were negative. 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 Pancreatic stool elastase was normal but fecal calprotectin elevated at 422. It was recommended she undergo a repeat colonoscopy which she did with her local GI. This was consistent with microscopic colitis and she was started on budesonide 9 mg daily.     She was initially doing well but then started to have a recurrence of symptoms. In September she tried some foods she shouldn't have tried. She was eating gluten free bread and thinks there were things int he bread she couldn't tolerate. She has been under increased stress up until 10 days ago. She was started on cholestyramine BID. She is still on budesonide 9 mg daily.     Since October 17th, she has had 2 bowel movements a day. Stools are more formed now. No blood in her    She was recently seen by the [facility]. She has been working on the foods she can and can't tolerate. She is also asking if she can take magnesium to help with some cramping.               Review of System: the patient denied fevers, chills, fatigue, nausea, vomiting, constipation, melena, hematochezia,  heartburn, acid reflux, chest pain, palpitations, shortness of breath, jaundice, scleral icterus, confusion, or rashes.       Past Medical & Surgical History:  There is no problem list on file for this patient.     No past medical history on file.  No past surgical history on file.    Relevant Family History:     [x]   No family history of colorectal cancer, gastric cancer, inflammatory bowel disease or celiac disease.   []   Family history of colorectal cancer    []   Family history of IBD   []   Family history of celiac disease    Family history of IBS in mother      Relevant Social History:  No tobacco or drug use      MEDICATIONS:  Medications that the patient states to be currently taking   Medication Sig   ? budesonide 9 mg TB24 ER tablet Take 9 mg by mouth daily.   ? cholestyramine 4 g powder packet Take 1 packet (4 g total) by mouth two (2) times daily.         Allergies :    is allergic to codeine.      Physical Exam:    Vitals: Ht 5' 6'' (1.676 m)  ~ Wt 107 lb (48.5 kg)  ~ BMI 17.27 kg/m?    Body mass index is 17.27 kg/m?Marland Kitchen   Constitutional:WD/WN alert, appears stated age and cooperative   HEENT: no scleral icterus visible   Skin: no jaundice visible   Neuro: A and O x3    Psychiatric: Appropriate affect         Lab Review:  No results found for: WBC, HGB, HCT, MCV, PLT  No results found for: CREAT, BUN, NA, K, CL, CO2, ALT, AST, GGT, ALKPHOS, BILITOT, ALBUMIN, AMYLASE, LIPASE  Lab Results   Component Value Date    GLIADINIGA 1.5 02/28/2021    GLIADINIGG <1.0 02/28/2021    IGASER 192 02/28/2021     No results found for: INR, FE, FEBINDSAT,  TIBC, FERRITIN, VITAMINB12, FOLATE  No results found for: HELPYLAB, HELPYLAGSTL      Imaging:     CT Abdomen/Pelvis (09/2020):    IMPRESSION: ?   Moderate pancolitis. ?Correlate clinically for underlying etiology.       GI Studies:     Colonoscopy (10/2020):  Findings:   ?? ? A few diverticula were found in the descending colon and transverse   ?? ? colon.   ?? ? The exam was otherwise without abnormality.   ?? ??Random colon biopsies obtained, Biopsies were taken with a cold forceps   ?? ? for histology.   ?? ? ? ? ? ? ? ? ? ? ? ? ?   FINAL DIAGNOSIS     A.  Random colon, biopsy:  ? Colonic mucosa with increased chronic inflammation, lamina propria collagen and patchy increase in eosinophils.  ? Negative for active inflammation, granulomas and dysplasia.  ?  ?   A microscopic examination is performed unless specimens are designated as ''gross only''.  ?   Electronically signed by Thereasa Solo, MD on 11/06/2020 at ?3:20 PM   COMMENT     Sections from the biopsy show an increased number of lymphocytes, plasma cells and eosinophils in the lamina propria.  Mild patchy intra-epithelial lymphocytes are present, quantitatively is insufficient for a definitive diagnosis of lymphocytic/collagenous colitis. The findings in the colon biopsy are non-specific with possible etiologies including medication injury, infection, irritable bowel syndrome, allergies, or an early/resolving/variant form of microscopic colitis. Medical Decision Making addressed on 06/20/2021:      Number and Complexity of Problems Addressed at the Encounter:   [x]   1 or more chronic illness with exacerbation, progression, or side effects of treatment  []   2 or more stable chronic illness  []   1 undiagnosed new problem with uncertain diagnosis  []   1 acute illness with systemic symptoms  []   1 acute complicated injury     Review of Data: I have  [x]  Reviewed/ordered ? 3 unique laboratory, radiology, and/or diagnostic tests noted    [x]  Reviewed prior external notes and incorporated into patient assessment as noted  []  I have independently interpreted test performed by other physician(s) as noted   []  Discussed management or test interpretation with external provider(s) as noted       Risk of Complication and or Morbidity or Mortality of Patient Management including Social Determinants of Health:   [x]   I deem the above diagnoses to have a risk of complication, morbidity or mortality of Moderate   []  The diagnosis or treatment of said conditions is significantly limited by social determinants of health as noted.     Visit Diagnoses  / Problems addressed on 06/20/2021:     Encounter Diagnoses   Name Primary?   ? Microscopic colitis, unspecified microscopic colitis type Yes   ? Irritable bowel syndrome with diarrhea           Assessment & Plan :       65 year old female with a pmh of previous appendectomy and previous cholecystectomy in 1988 who presents for follow up of diarrhea.     #Microscopic Colitis:  #IBS-D:  Patient previously seen in 02/2021 with around a 6 month history of crampy lower abdominal pain, bloating, visible abdominal distention, and persistent watery diarrhea (mostly Bristol 7). Abdominal pain was related to bowel movements and partially relieved with defecation. Symptoms were exacerbated by dietary intake and increased stress. Initially suspected that she had post-infection  IBS with increased visceral hypersensitivity as symptoms started after she had a viral gastroenteritis back in December 2021; however, microscopic colitis was also in the differential as colonoscopy in 10/2020 was endoscopically normal but random colon biopsies showed chronic inflammation with increased lamina propria collagen and patchy increases in eosinophils and lymphocytes. Initial biopsies were insufficient to make a diagnosis of microscopic colitis, with differential including medication injury, infection, IBS, or an early form of microscopic colitis (had previously been taking Aleve). Repeat fecal calprotectin was elevated at 422 and she underwent a repeat colonoscopy by her local GI that confirmed microscopic colitis. She has been on budesonide 9 mg daily for around 3.5 months. Initially this led to improvement but then she had a flare up of symptoms, likely in the setting of increased stress (suspect she does have overlapping IBS-D with microscopic colitis). She was resumed on cholestyramine 4 g BID which has been helping. For the past 10 days she has had 2 mostly formed stools/day.       Plan:  - If symptoms are continuing to improve, can start to wean budesonide. Decrease to 6 mg budesonide daily x 4 weeks. If still improving, can decrease to 3 mg daily x 4 weeks and then off if doing well  - Continue cholestyramine 4 g BID. Can titrate up to QID as tolerated  - IBGard for abdominal cramping  - Ok to continue L glutamine 5 g TID   - Follow up with [facility]  - Video link on diaphragmatic breathing exercises previously provided. Courtesy of Donnella Sham  - Continue relaxation techniques and mindfulness exercises  - RTC 6 weeks            The above recommendation were discussed with the patient. The patient has all questions answered satisfactorily and is in agreement with this recommended plan of care.    Chereese Cilento L. Lincoln Brigham, MD   06/20/2021 9:01 AM      I reviewed the patient's labs, imaging, and/or endoscopies and my interpretation is above.     I reviewed notes from the patient's outside records and have incorporated this into the patient history.     35 minutes were spent personally by me today on this encounter which may include today's pre-visit review of the chart, time spent during the visit, and  today's time spent  after the visit  documenting and coordinating care.

## 2021-06-20 NOTE — Patient Instructions
Hi Ms. Kaufmann,     It was nice to see you virtually today. Below is a summary of my recommendations:      - If you are feeling better, you can start to taper the budesonide. I would decrease budesonide to 6 mg daily x 4 weeks. If still doing well, we can try to taper to 3 mg daily at that point for another 4 weeks and then off  - continue the cholestyramine 4 g twice a day. Can titrate this as needed, up to a max of 4 times a day  - continue to follow with the dietitian Tresa Endo    - Follow up in about 6 weeks      Best,   Jannet Mantis, MD  Gastroenterology

## 2021-06-20 NOTE — Telephone Encounter
Message to Practice/Provider      Message: Pt called to request to not send budesonide not send to pharmacy. She was grateful for the consult today and states she will have her local GI MD order that med instead.  Thank you    Return call is not being requested by the patient or caller.    Patient or caller has been notified of the turnaround time of 1-2 business day(s).

## 2021-06-21 NOTE — Telephone Encounter
Reply by: Pati Gallo. Jaquesha Boroff  Ok thank you for letting me know. Will cancel budesonide order I placed

## 2021-06-21 NOTE — Addendum Note
Addended by: Peri Jefferson on: 06/20/2021 08:28 PM     Modules accepted: Orders
